# Patient Record
Sex: Male | Born: 1955 | Race: Black or African American | Hispanic: No
Health system: Southern US, Community
[De-identification: ages and names within clinical notes are randomized; demographics above are authoritative.]

---

## 2005-06-14 ENCOUNTER — Emergency Department: Payer: Self-pay | Admitting: Emergency Medicine

## 2006-05-23 ENCOUNTER — Emergency Department: Payer: Self-pay | Admitting: General Practice

## 2011-12-18 ENCOUNTER — Telehealth: Payer: Self-pay | Admitting: *Deleted

## 2011-12-18 NOTE — Telephone Encounter (Signed)
Opened chart in error.

## 2013-04-30 ENCOUNTER — Emergency Department: Payer: Self-pay | Admitting: Emergency Medicine

## 2013-04-30 LAB — URINALYSIS, COMPLETE
Bacteria: NONE SEEN
Glucose,UR: NEGATIVE mg/dL (ref 0–75)
Ketone: NEGATIVE
Nitrite: NEGATIVE
Ph: 5 (ref 4.5–8.0)
RBC,UR: <1 /HPF (ref 0–5)
Specific Gravity: 1.012 (ref 1.003–1.030)
Squamous Epithelial: NONE SEEN
WBC UR: <1 /HPF (ref 0–5)

## 2013-04-30 LAB — SALICYLATE LEVEL: Salicylates, Serum: 4.4 mg/dL — ABNORMAL HIGH

## 2013-04-30 LAB — CBC
HCT: 43.9 % (ref 40.0–52.0)
MCH: 31.6 pg (ref 26.0–34.0)
MCHC: 35.2 g/dL (ref 32.0–36.0)
MCV: 90 fL (ref 80–100)
Platelet: 189 10*3/uL (ref 150–440)
RBC: 4.88 10*6/uL (ref 4.40–5.90)

## 2013-04-30 LAB — COMPREHENSIVE METABOLIC PANEL
Albumin: 4.1 g/dL (ref 3.4–5.0)
Alkaline Phosphatase: 115 U/L (ref 50–136)
Anion Gap: 3 — ABNORMAL LOW (ref 7–16)
Bilirubin,Total: 0.5 mg/dL (ref 0.2–1.0)
EGFR (African American): >60
EGFR (Non-African Amer.): >60
Osmolality: 277 (ref 275–301)
Potassium: 3.7 mmol/L (ref 3.5–5.1)
SGOT(AST): 35 U/L (ref 15–37)
SGPT (ALT): 41 U/L (ref 12–78)
Total Protein: 8.2 g/dL (ref 6.4–8.2)

## 2013-04-30 LAB — ETHANOL
Ethanol %: <0.003 % (ref 0.000–0.080)
Ethanol: <3 mg/dL

## 2013-04-30 LAB — DRUG SCREEN, URINE
Amphetamines, Ur Screen: NEGATIVE (ref ?–1000)
Cannabinoid 50 Ng, Ur ~~LOC~~: NEGATIVE (ref ?–50)
Methadone, Ur Screen: NEGATIVE (ref ?–300)
Opiate, Ur Screen: NEGATIVE (ref ?–300)

## 2013-04-30 LAB — TSH: Thyroid Stimulating Horm: 1.41 u[IU]/mL

## 2013-06-28 ENCOUNTER — Ambulatory Visit: Payer: Self-pay

## 2013-07-20 LAB — CBC WITH DIFFERENTIAL/PLATELET
Basophil #: 0 10*3/uL (ref 0.0–0.1)
Eosinophil #: 0 10*3/uL (ref 0.0–0.7)
Eosinophil %: 0.1 %
HCT: 41.9 % (ref 40.0–52.0)
Lymphocyte #: 0.8 10*3/uL — ABNORMAL LOW (ref 1.0–3.6)
MCH: 32 pg (ref 26.0–34.0)
MCHC: 34.6 g/dL (ref 32.0–36.0)
Monocyte #: 0.9 x10 3/mm (ref 0.2–1.0)
Monocyte %: 5.7 %
Neutrophil %: 89.3 %
RDW: 13.3 % (ref 11.5–14.5)

## 2013-07-20 LAB — COMPREHENSIVE METABOLIC PANEL
Anion Gap: 5 — ABNORMAL LOW (ref 7–16)
BUN: 16 mg/dL (ref 7–18)
Bilirubin,Total: 1.2 mg/dL — ABNORMAL HIGH (ref 0.2–1.0)
Chloride: 102 mmol/L (ref 98–107)
Co2: 29 mmol/L (ref 21–32)
Glucose: 137 mg/dL — ABNORMAL HIGH (ref 65–99)
Potassium: 3.5 mmol/L (ref 3.5–5.1)
SGPT (ALT): 20 U/L (ref 12–78)
Sodium: 136 mmol/L (ref 136–145)
Total Protein: 8.7 g/dL — ABNORMAL HIGH (ref 6.4–8.2)

## 2013-07-20 LAB — URINALYSIS, COMPLETE
Bacteria: NONE SEEN
Bilirubin,UR: NEGATIVE
Blood: NEGATIVE
Leukocyte Esterase: NEGATIVE
RBC,UR: 2 /HPF (ref 0–5)
WBC UR: 5 /HPF (ref 0–5)

## 2013-07-21 ENCOUNTER — Ambulatory Visit: Payer: Self-pay | Admitting: Surgery

## 2013-08-13 ENCOUNTER — Ambulatory Visit: Payer: Self-pay | Admitting: Internal Medicine

## 2013-10-15 LAB — PATHOLOGY REPORT

## 2014-07-01 IMAGING — CR DG KNEE 1-2V*L*
1 series · 3 of 3 positions shown · non-contrast
Comparison: None

CLINICAL DATA: Disability, arthritis in both knees

EXAM:
LEFT KNEE - 1-2 VIEW

[Series 1: ap · 0.17mm/px · 3 of 3 slices shown]
[im 1/3]
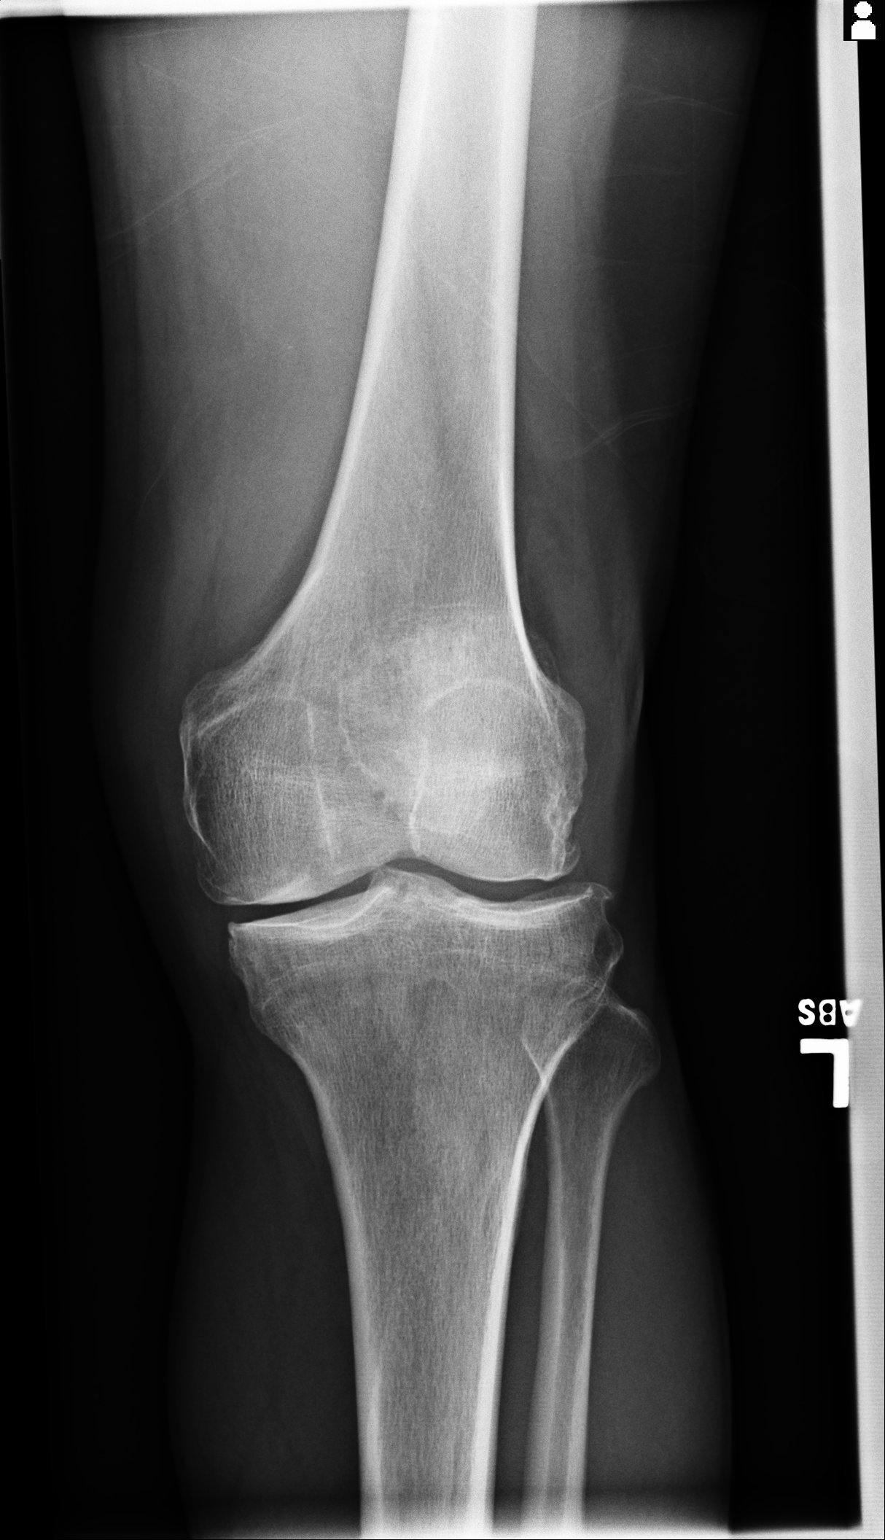
[im 2/3]
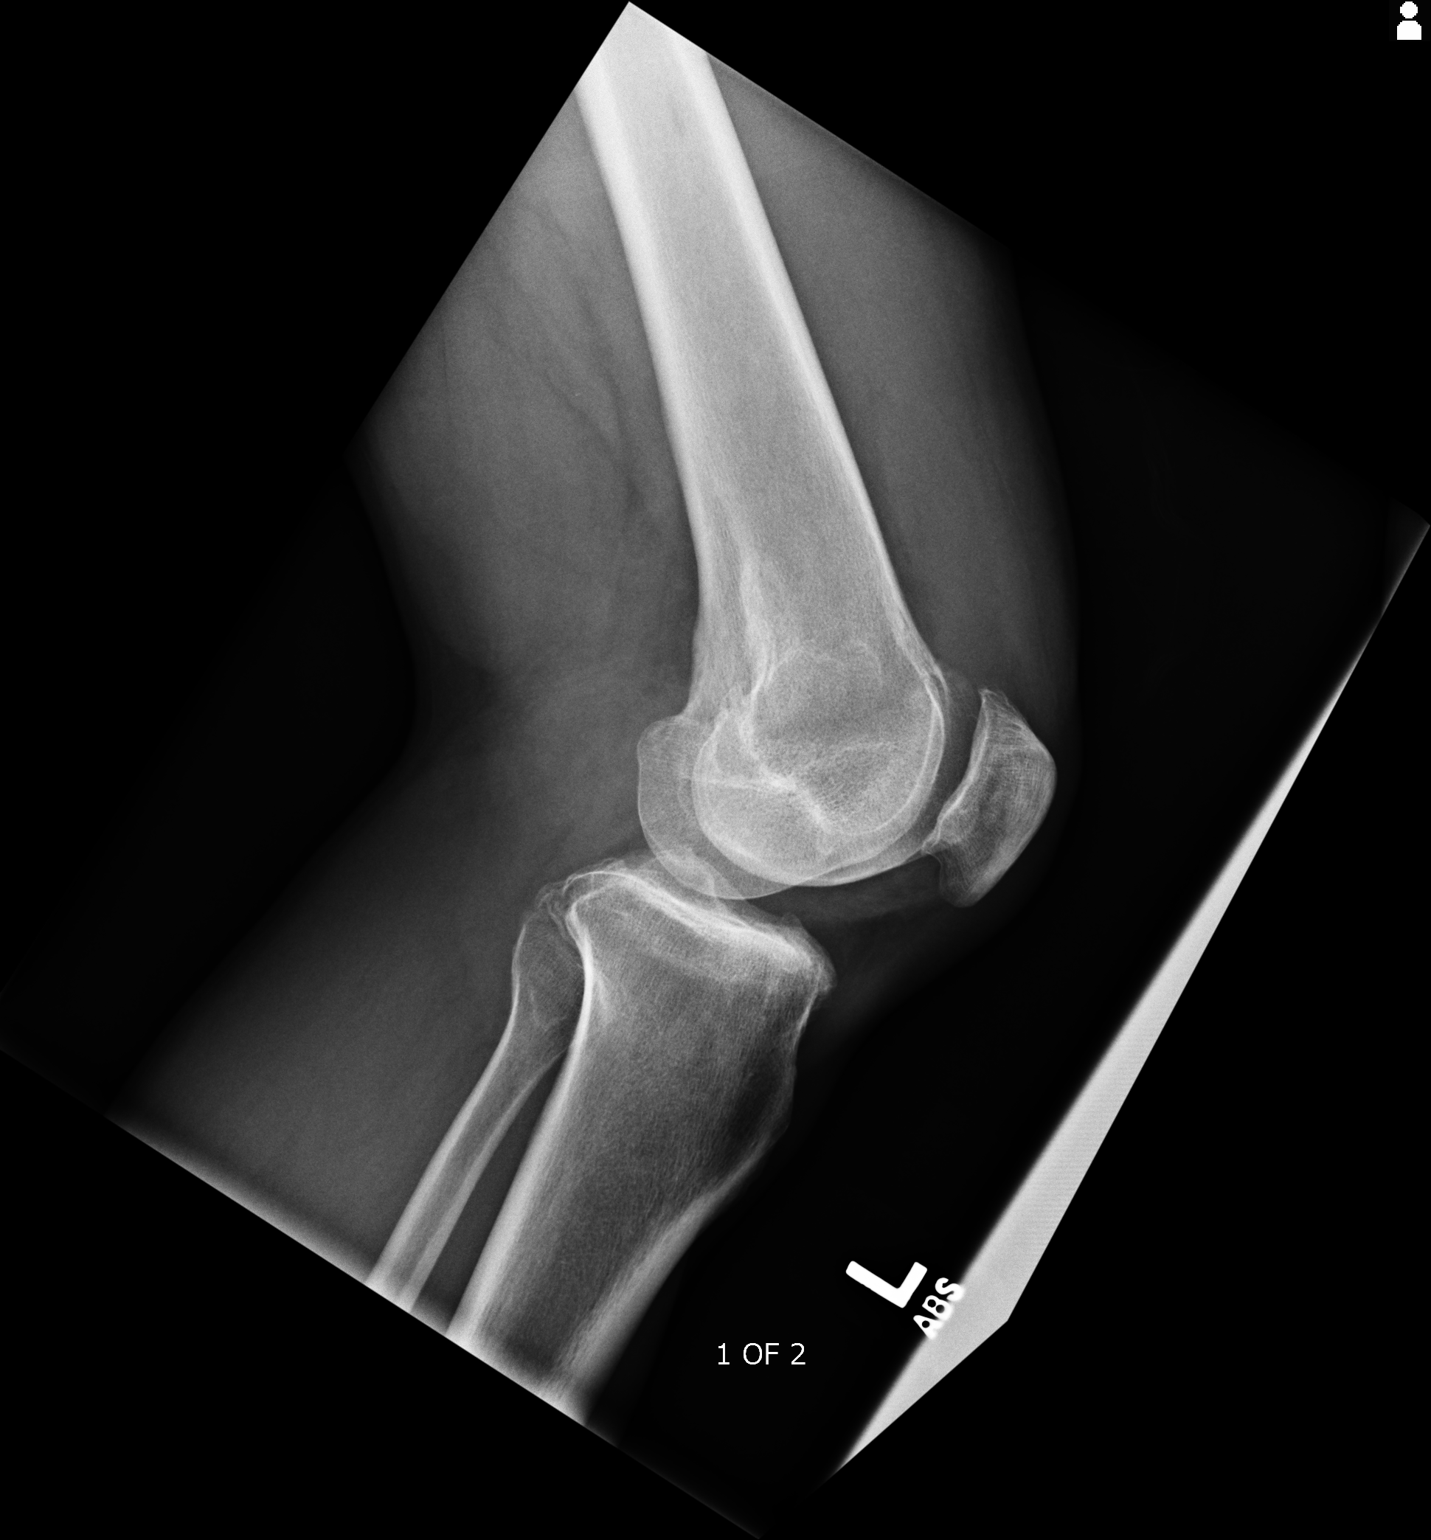
[im 3/3]
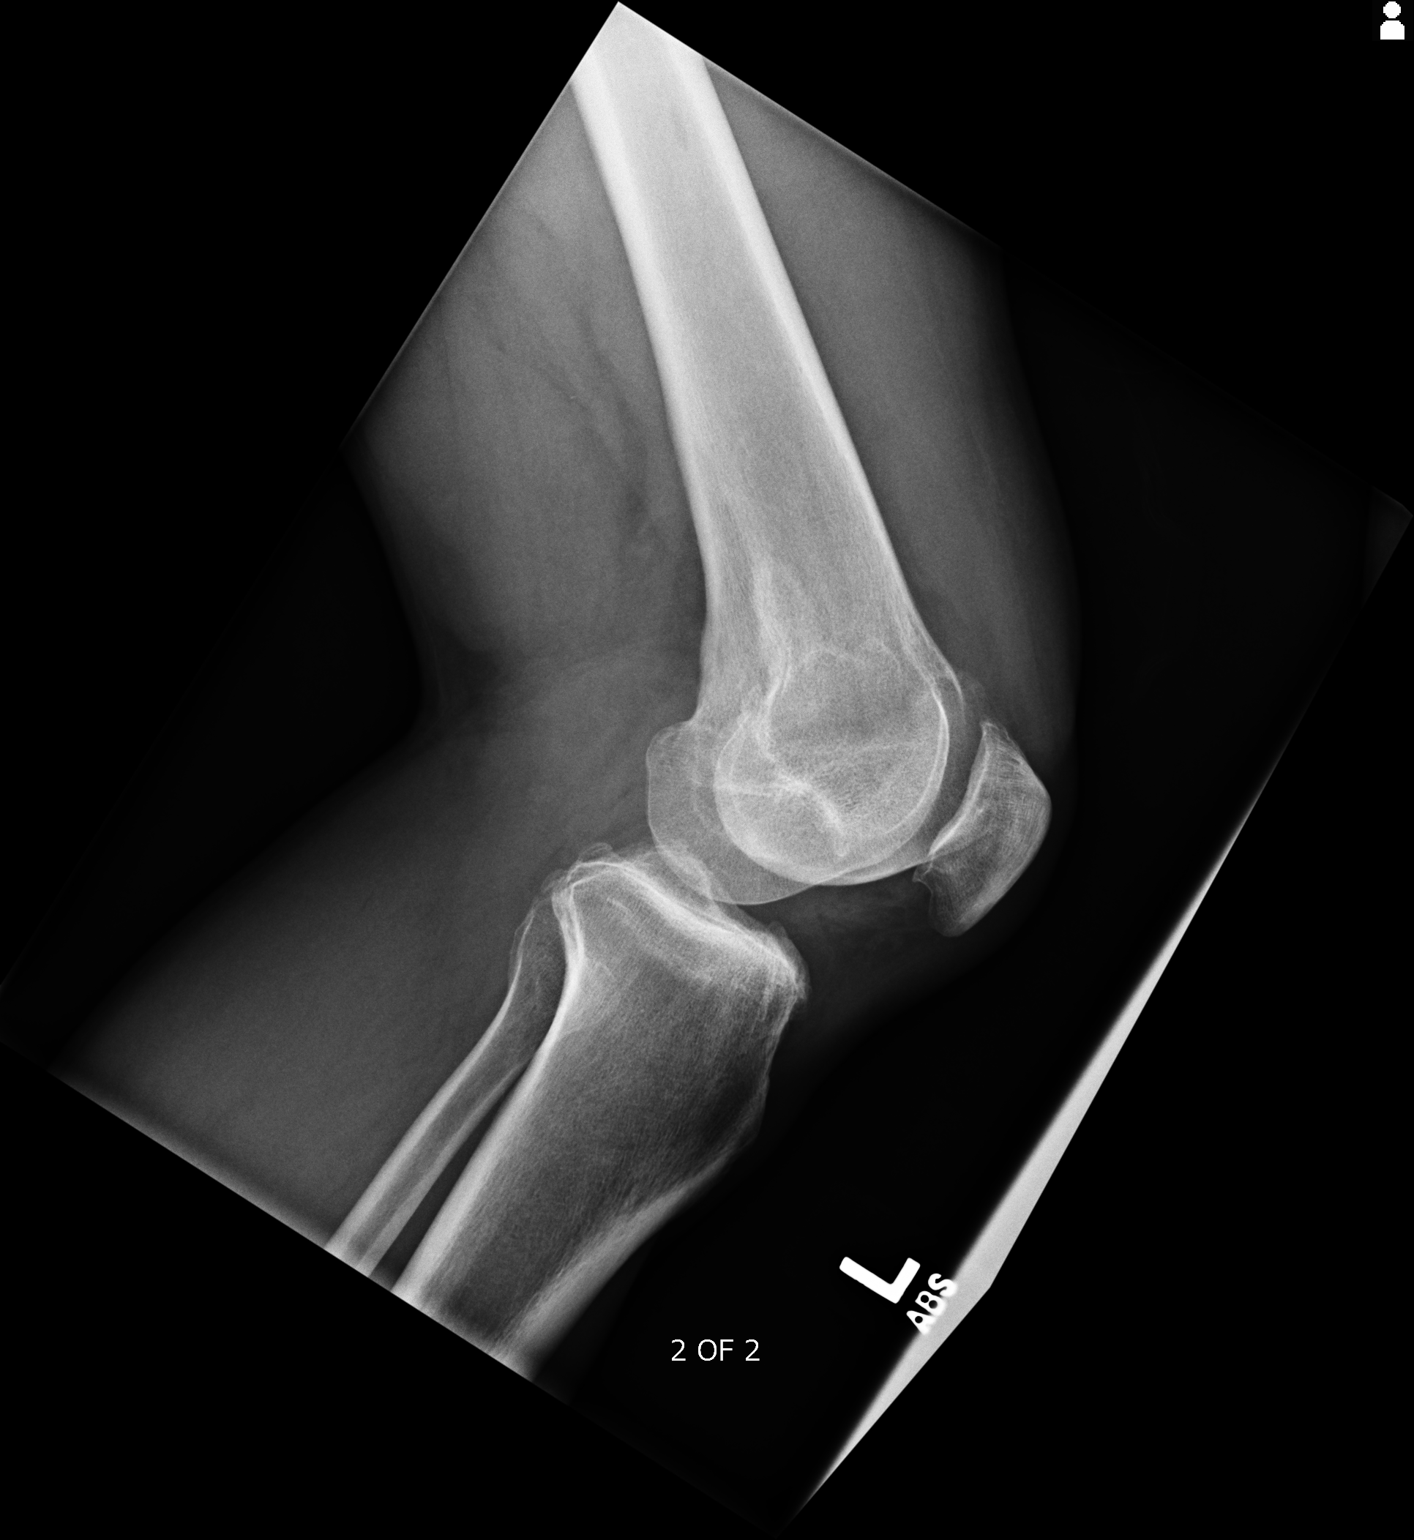

[3 of 3 positions shown; findings below may reference images not displayed]

FINDINGS: Osseous mineralization probably normal for technique.

Tricompartmental osteoarthritic changes with joint space narrowing
and marginal spur formation.

No acute fracture, dislocation or bone destruction.

Question minimal joint effusion.

Soft tissues otherwise unremarkable.
IMPRESSION: Tricompartmental osteoarthritic changes left knee.

## 2014-07-01 IMAGING — CR DG KNEE 1-2V*R*
1 series · 2 of 2 positions shown · non-contrast
Comparison: None.

CLINICAL DATA: Right knee pain

EXAM:
RIGHT KNEE - 1-2 VIEW

[Series 1: ap · 0.17mm/px · 2 of 2 slices shown]
[im 1/2]
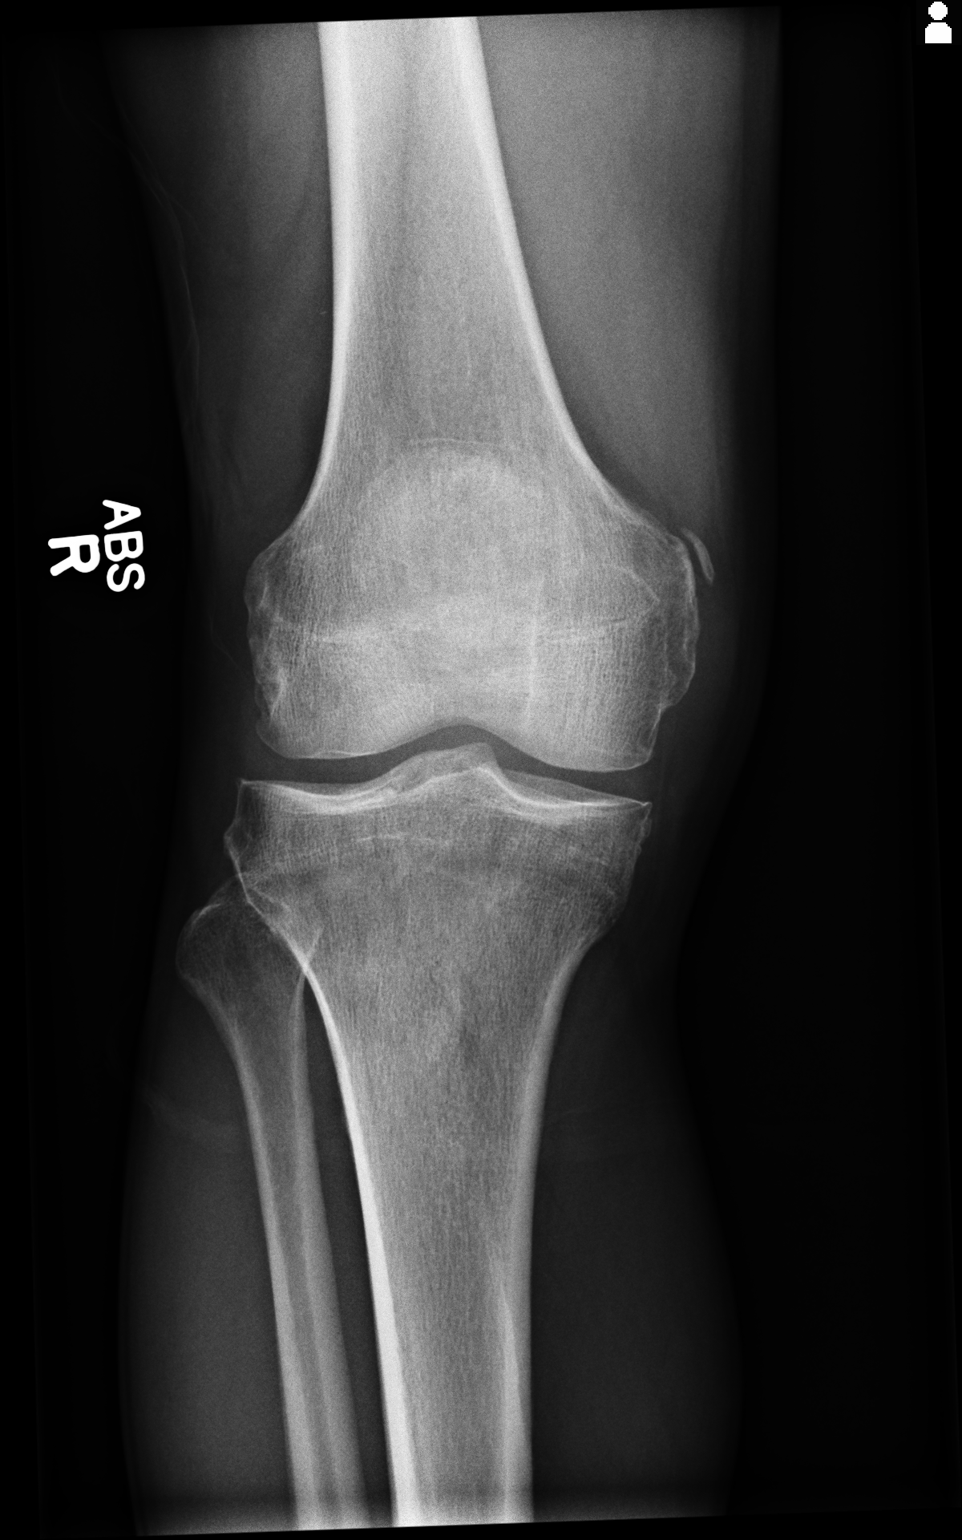
[im 2/2]
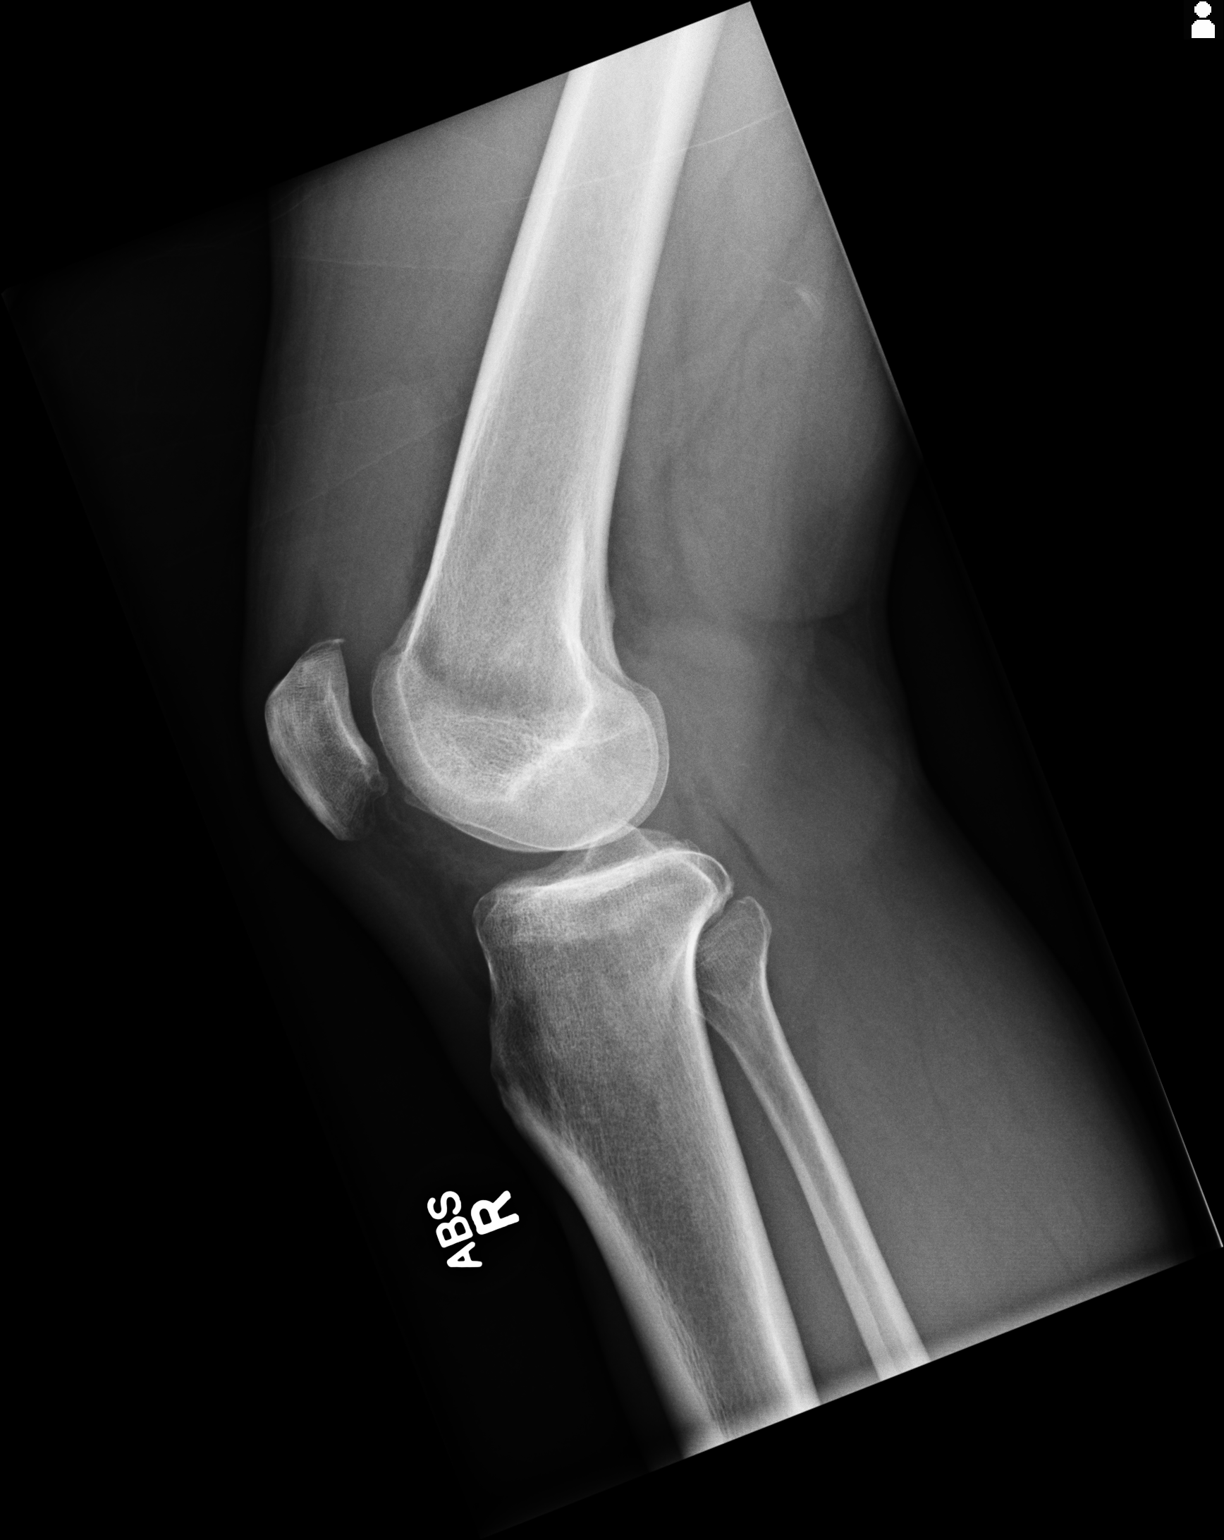

[2 of 2 positions shown; findings below may reference images not displayed]

FINDINGS: Normal alignment. Small to moderate joint effusion suspected on the
lateral view. Negative for fracture. Ossified density along in the
medial femoral condyle noted at the MCL insertion, suspect related
to prior MCL injury. Preserved joint spaces. No definite soft tissue
abnormality.
IMPRESSION: No acute osseous finding

Joint effusion

Evidence of prior MCL injury

## 2014-11-25 NOTE — H&P (Signed)
PATIENT NAME:  Derrick Anderson, EDWARD J MR#:  [id number] DATE OF BIRTH:  July 02, 1956  DATE OF ADMISSION:  07/20/2013  CHIEF COMPLAINT: Right lower quadrant pain.   HISTORY OF PRESENT ILLNESS: This is a patient with 2 days of right lower quadrant pain. It is worsening. He has never had an episode like this before. He points to McBurney's point as the source of his pain. He has had nausea and anorexia but has not vomited. He has had no fevers or chills. He denies any back pain or dysuria. No melena or hematochezia.    PAST MEDICAL HISTORY: Myocardial infarction with stent placement in 2000. He does not regularly see a cardiologist but is on medications for both coronary artery disease, hypertension and diabetes. He denies chest pain or shortness of breath of an exertional nature at this point. He does abuse tobacco and alcohol regularly.   PAST SURGICAL HISTORY: Stent placement. No abdominal surgery.   ALLERGIES: None.   MEDICATIONS: Lisinopril, metformin and aspirin.   FAMILY HISTORY: Noncontributory.   SOCIAL HISTORY: The patient works as a Gafferhandyman. He smokes tobacco daily. Drinks beer daily.   REVIEW OF SYSTEMS: A 10 system review is performed and negative with the exception of that mentioned in the HPI.   PHYSICAL EXAMINATION:  GENERAL: Healthy-appearing black male patient.  VITAL SIGNS: He is afebrile. Vital signs are stable.  HEENT: No scleral icterus.  NECK: No palpable neck nodes.  CHEST: Clear to auscultation.  CARDIAC: Regular rate and rhythm.  ABDOMEN: Soft. There is tenderness in the right lower quadrant at McBurney's point with some minimal percussion tenderness and a positive Rovsing's sign.  EXTREMITIES: Without edema.  NEUROLOGIC: Grossly intact.  INTEGUMENT: No jaundice.   LABORATORY AND RADIOLOGY DATA: CT scan is personally reviewed showing appendicitis.   Electrolytes are within normal limits. White blood cell count is 16,000, H and H of 14 and 42 with a platelet count of  186.   ASSESSMENT AND PLAN: This is a patient with appendicitis both by history, physical and CT findings, with leukocytosis. I have recommended laparoscopy, laparoscopic appendectomy. The options of observation have been reviewed. The risks of bleeding, infection, recurrence of symptoms, negative laparoscopy and open procedure were all reviewed with him. He understood and agreed to proceed. I also discussed with him the fact that he does not regularly see a cardiologist and that he had stents placed 15 years ago and may need additional workup, but at this point he is experiencing an acute surgical emergency and requires laparoscopy at this point. He understood and agreed with this plan.   ____________________________ Adah Salvageichard E. Excell Seltzerooper, MD rec:gb D: 07/20/2013 21:43:56 ET T: 07/20/2013 21:59:11 ET JOB#: [id number]  cc: Adah Salvageichard E. Excell Seltzerooper, MD, <Dictator> Lattie HawICHARD E Curlie Sittner MD ELECTRONICALLY SIGNED 07/21/2013 6:50

## 2014-11-25 NOTE — H&P (Signed)
Subjective/Chief Complaint RLQ pain   History of Present Illness two days rlq pain, nausea, no emesis. not eating no f/c, nml BM no prior episode   Past History MI, stents 2000 does not see cardiologist no CP or SOB DM tob abuse, ETOH daily   Past Medical Health Coronary Artery Disease, Diabetes Mellitus, Smoking, Alcohol Consumption/Dependency   Past Med/Surgical Hx:  Hypercholesterolemia:   Stent - Cardiac:   ALLERGIES:  No Known Allergies:   HOME MEDICATIONS: Medication Instructions Status  lisinopril 10 mg oral tablet 1 tab(s) orally once a day Active  metFORMIN 500 mg oral tablet 1 tab(s) orally once a day Active  aspirin 325 mg oral tablet 1 tab(s) orally once a day Active   Family and Social History:  Family History Non-Contributory   Social History positive  tobacco, positive ETOH   + Tobacco Current (within 1 year)   Place of Living Home   Review of Systems:  Fever/Chills No   Cough No   Abdominal Pain Yes   Diarrhea No   Constipation No   Nausea/Vomiting Yes   SOB/DOE No   Chest Pain No   Dysuria No   Tolerating Diet No  Nauseated   Medications/Allergies Reviewed Medications/Allergies reviewed   Physical Exam:  GEN no acute distress   HEENT pink conjunctivae   NECK supple   RESP normal resp effort  clear BS   CARD regular rate   ABD positive tenderness  soft  pos Rovsing's   LYMPH negative neck   EXTR negative edema   SKIN normal to palpation   PSYCH alert, A+O to time, place, person, good insight   Lab Results: Hepatic:  16-Dec-14 16:50   Bilirubin, Total  1.2  Alkaline Phosphatase 82 (45-117 NOTE: New Reference Range 06/25/13)  SGPT (ALT) 20  SGOT (AST) 19  Total Protein, Serum  8.7  Albumin, Serum 4.1  Routine Chem:  16-Dec-14 16:50   Glucose, Serum  137  BUN 16  Creatinine (comp) 1.13  Sodium, Serum 136  Potassium, Serum 3.5  Chloride, Serum 102  CO2, Serum 29  Calcium (Total), Serum  10.4   Osmolality (calc) 275  eGFR (African American) >60  eGFR (Non-African American) >60 (eGFR values <7m/min/1.73 m2 may be an indication of chronic kidney disease (CKD). Calculated eGFR is useful in patients with stable renal function. The eGFR calculation will not be reliable in acutely ill patients when serum creatinine is changing rapidly. It is not useful in  patients on dialysis. The eGFR calculation may not be applicable to patients at the low and high extremes of body sizes, pregnant women, and vegetarians.)  Anion Gap  5  Lipase  66 (Result(s) reported on 20 Jul 2013 at 05:21PM.)  Routine UA:  16-Dec-14 16:51   Color (UA) Amber  Clarity (UA) Hazy  Glucose (UA) Negative  Bilirubin (UA) Negative  Ketones (UA) Trace  Specific Gravity (UA) 1.019  Blood (UA) Negative  pH (UA) 5.0  Protein (UA) 100 mg/dL  Nitrite (UA) Negative  Leukocyte Esterase (UA) Negative (Result(s) reported on 20 Jul 2013 at 05:15PM.)  RBC (UA) 2 /HPF  WBC (UA) 5 /HPF  Bacteria (UA) NONE SEEN  Epithelial Cells (UA) NONE SEEN  Mucous (UA) PRESENT (Result(s) reported on 20 Jul 2013 at 05:15PM.)  Routine Hem:  16-Dec-14 16:50   WBC (CBC)  16.3  RBC (CBC) 4.53  Hemoglobin (CBC) 14.5  Hematocrit (CBC) 41.9  Platelet Count (CBC) 186  MCV 93  MCH 32.0  MCHC  34.6  RDW 13.3  Neutrophil % 89.3  Lymphocyte % 4.7  Monocyte % 5.7  Eosinophil % 0.1  Basophil % 0.2  Neutrophil #  14.6  Lymphocyte #  0.8  Monocyte # 0.9  Eosinophil # 0.0  Basophil # 0.0 (Result(s) reported on 20 Jul 2013 at [facility].)   Radiology Results: CT:    16-Dec-14 20:46, CT Abdomen and Pelvis With Contrast  CT Abdomen and Pelvis With Contrast  REASON FOR EXAM:    (1) rlq pain, ttp, leukocytosis; (2) rlq pain, ttp,   leukocytosis  COMMENTS:       PROCEDURE: CT  - CT ABDOMEN / PELVIS  W  - Jul 20 2013  8:46PM     CLINICAL DATA:  Right-side abdominal pain since yesterday, no relief  with painmeds, tenderness to palpation,  leukocytosis    EXAM:  CT ABDOMEN AND PELVIS WITH CONTRAST    TECHNIQUE:  Multidetector CT imaging of the abdomen and pelvis was performed  using the standard protocol following bolus administration of  intravenous contrast. Sagittal and coronal MPR images reconstructed  from axial data set.    CONTRAST:  Dilute oral contrast. 125 cc Isovue 370 IV    COMPARISON:  None    FINDINGS:  Bibasilar atelectasis slightly greater on left.    15 x 12 mm upper pole right renal cyst image 23.    Liver, spleen, pancreas, kidneys, and adrenal glands otherwise  normal appearance.  Unremarkable stomach.    Enlarged appendix with thickened wall and periappendiceal  inflammatory changes compatible with acute appendicitis.    No definite evidence of perforation or abscess.    Small amount of nonspecific free pelvic fluid and stranding of right  flank intra abdominal fat planes.    Few diverticula noted at ascending colon.    Few small bowel loops are prominent size up to 3.6 cmdiameter  though no definite point of obstruction is seen.  Colon decompressed.    No mass, hernia, or acute osseous findings.    Bladder and ureters normal appearance.     IMPRESSION:  Acute appendicitis without evidence of perforation or abscess.    Small amount of nonspecific free pelvic fluid.    Findings called to Lavonia Drafts on 07/20/2013 at 2054 hrs.      Electronically Signed    By: Lavonia Dana M.D.    On: 07/20/2013 20:56         Verified By: Burnetta Sabin, M.D.,    Assessment/Admission Diagnosis ac appendicitis lap appy risks and options agrees with plan   Electronic Signatures: Florene Glen (MD)  (Signed 16-Dec-14 21:40)  Authored: CHIEF COMPLAINT and HISTORY, PAST MEDICAL/SURGIAL HISTORY, ALLERGIES, HOME MEDICATIONS, FAMILY AND SOCIAL HISTORY, REVIEW OF SYSTEMS, PHYSICAL EXAM, LABS, Radiology, ASSESSMENT AND PLAN   Last Updated: 16-Dec-14 21:40 by Florene Glen (MD)

## 2014-11-25 NOTE — Op Note (Signed)
PATIENT NAME:  Derrick Anderson, Derrick Anderson  DATE OF PROCEDURE:  07/21/2013  PREOPERATIVE DIAGNOSIS: Acute appendicitis.   POSTOPERATIVE DIAGNOSIS: Acute appendicitis.   PROCEDURE: Laparoscopic appendectomy.   SURGEON: Roschelle Calandra E. Excell Seltzerooper, M.D.   ANESTHESIA: General with endotracheal tube.   INDICATIONS: This is a patient with progressive right lower quadrant pain and tenderness with signs of peritoneal irritation and CT findings suggestive of appendicitis with leukocytosis. Preoperatively, we discussed rationale for surgery, the options of observation, risks of bleeding, infection, recurrence, open procedure and negative laparoscopy. This was all reviewed for him. He understood and agreed to proceed.   FINDINGS: Gangrenous appendicitis with periappendiceal inflammation of the small bowel.   DESCRIPTION OF PROCEDURE: The patient was induced under general anesthesia. Foley catheter was placed. VTE prophylaxis was in place, and antibiotics were given IV. He was then prepped and draped in a sterile fashion. Marcaine was infiltrated in the skin and subcutaneous tissues around the periumbilical area. An incision was made. Veress needle was placed. Pneumoperitoneum was obtained, and a 5 mm trocar port was placed. The abdominal cavity was explored and under direct vision, a 12 mm left lateral port was placed, followed by a suprapubic 5 mm port. The appendix was then identified in the right lower quadrant surrounded by 2 loops of small bowel. These loops were separated to allow for visualization of the appendix. The base of the appendix was then divided utilizing the standard load Endo GIA, and then the mesoappendix was divided with a vascular load Endo GIA and the specimen was passed out through the lateral port site with the aid of an Endo Catch bag. The area was checked for hemostasis. There was no sign of bleeding or bowel injury. The inflamed small bowel was photographed,  and then under direct vision, the left lateral port site was closed at its fascial edges with interrupted 0 Vicryl sutures utilizing an Endo Close technique. Further inspection failed to show any sign of bleeding or bowel injury. Therefore, pneumoperitoneum was released. All ports were removed, and 4-0 subcuticular Monocryl was used on all skin edges. Steri-Strips, Mastisol and sterile dressings were placed.   The patient tolerated the procedure well. There were no complications. He was taken to the recovery room in stable condition to be admitted for continued care.   ____________________________ Adah Salvageichard E. Excell Seltzerooper, MD rec:gb D: 07/21/2013 00:09:00 ET T: 07/21/2013 00:21:25 ET JOB#: [id number]  cc: Adah Salvageichard E. Excell Seltzerooper, MD, <Dictator> Lattie HawICHARD E Shivon Hackel MD ELECTRONICALLY SIGNED 07/21/2013 6:51

## 2019-09-06 DEATH — deceased
